# Patient Record
Sex: Male | Born: 1990
Health system: Southern US, Community
[De-identification: ages and names within clinical notes are randomized; demographics above are authoritative.]

## PROBLEM LIST (undated history)

## (undated) DIAGNOSIS — K5900 Constipation, unspecified: Secondary | ICD-10-CM

## (undated) HISTORY — DX: Constipation, unspecified: K59.00

## (undated) HISTORY — PX: HERNIA REPAIR: SHX51

---
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
## 2016-12-04 ENCOUNTER — Encounter (HOSPITAL_COMMUNITY): Payer: Self-pay | Admitting: Emergency Medicine

## 2016-12-04 ENCOUNTER — Emergency Department (HOSPITAL_COMMUNITY)
Admission: EM | Admit: 2016-12-04 | Discharge: 2016-12-05 | Disposition: A | Discharge status: Home / Self Care | Payer: BLUE CROSS/BLUE SHIELD | Attending: Emergency Medicine | Admitting: Emergency Medicine

## 2016-12-04 DIAGNOSIS — R109 Unspecified abdominal pain: Secondary | ICD-10-CM | POA: Diagnosis not present

## 2016-12-04 DIAGNOSIS — M545 Low back pain: Secondary | ICD-10-CM | POA: Diagnosis present

## 2016-12-04 LAB — URINALYSIS, ROUTINE W REFLEX MICROSCOPIC
Bilirubin Urine: NEGATIVE
Glucose, UA: NEGATIVE mg/dL
HGB URINE DIPSTICK: NEGATIVE
Ketones, ur: NEGATIVE mg/dL
Leukocytes, UA: NEGATIVE
NITRITE: NEGATIVE
PH: 6 (ref 5.0–8.0)
Protein, ur: NEGATIVE mg/dL
Specific Gravity, Urine: 1.016 (ref 1.005–1.030)

## 2016-12-04 NOTE — ED Provider Notes (Signed)
[facility]-EMERGENCY DEPT Provider Note   CSN: [id number] Arrival date & time: 12/04/16  2045     History   Chief Complaint Chief Complaint  Patient presents with  . Spasms  . Back Pain    HPI Jason Collins is a 26 y.o. male.  The history is provided by the patient.  Back Pain   This is a new problem. The current episode started 1 to 2 hours ago. Episode frequency: once. The problem has been resolved (lasted for 5 minutes). The pain is associated with no known injury. The pain is present in the lumbar spine. The quality of the pain is described as shooting. The pain does not radiate. The pain is severe. Pertinent negatives include no chest pain, no fever, no headaches, no abdominal pain, no abdominal swelling, no bowel incontinence, no perianal numbness, no bladder incontinence and no dysuria. Associated symptoms comments: emesis.    History reviewed. No pertinent past medical history.  There are no active problems to display for this patient.   Past Surgical History:  Procedure Laterality Date  . HERNIA REPAIR         Home Medications    Prior to Admission medications   Not on File    Family History No family history on file.  Social History Social History  Substance Use Topics  . Smoking status: Never Smoker  . Smokeless tobacco: Never Used  . Alcohol use Yes     Comment: socially     Allergies   Tramadol   Review of Systems Review of Systems  Constitutional: Negative for chills and fever.  HENT: Negative for ear pain and sore throat.   Eyes: Negative for pain and visual disturbance.  Respiratory: Negative for cough and shortness of breath.   Cardiovascular: Negative for chest pain and palpitations.  Gastrointestinal: Negative for abdominal pain, bowel incontinence and vomiting.  Genitourinary: Positive for flank pain. Negative for bladder incontinence, decreased urine volume, difficulty urinating, dysuria, frequency, hematuria, scrotal swelling and  testicular pain.  Musculoskeletal: Positive for back pain. Negative for arthralgias.  Skin: Negative for color change and rash.  Neurological: Negative for seizures, syncope and headaches.  All other systems reviewed and are negative. Ten systems are reviewed and are negative for acute change except as noted in the HPI    Physical Exam Updated Vital Signs BP 140/85 (BP Location: Right Arm)   Pulse 76   Temp 98.5 F (36.9 C) (Oral)   Resp 20   Ht 5\' 6"  (1.676 m)   Wt 130 lb (59 kg)   SpO2 100%   BMI 20.98 kg/m   Physical Exam  Constitutional: He is oriented to person, place, and time. He appears well-developed and well-nourished. No distress.  HENT:  Head: Normocephalic and atraumatic.  Nose: Nose normal.  Eyes: Conjunctivae and EOM are normal. Pupils are equal, round, and reactive to light. Right eye exhibits no discharge. Left eye exhibits no discharge. No scleral icterus.  Neck: Normal range of motion. Neck supple.  Cardiovascular: Normal rate and regular rhythm.  Exam reveals no gallop and no friction rub.   No murmur heard. Pulmonary/Chest: Effort normal and breath sounds normal. No stridor. No respiratory distress. He has no rales.  Abdominal: Soft. He exhibits no distension. There is no tenderness.  Musculoskeletal: He exhibits no edema or tenderness.  Neurological: He is alert and oriented to person, place, and time.  Skin: Skin is warm and dry. No rash noted. He is not diaphoretic. No erythema.  Psychiatric:  He has a normal mood and affect.  Vitals reviewed.    ED Treatments / Results  Labs (all labs ordered are listed, but only abnormal results are displayed) Labs Reviewed  URINALYSIS, ROUTINE W REFLEX MICROSCOPIC    EKG  EKG Interpretation None       Radiology No results found.  Procedures Procedures (including critical care time)  Medications Ordered in ED Medications - No data to display   Initial Impression / Assessment and Plan / ED  Course  I have reviewed the triage vital signs and the nursing notes.  Pertinent labs & imaging results that were available during my care of the patient were reviewed by me and considered in my medical decision making (see chart for details).     Resolved left flank pain. Currently asymptomatic. Possible renal colic vs muscle spasm. UA w/o hematuria or infection. Low suspicion PE, SBO, serious intra-abd inflammatory/infection process, AAA.  The patient is safe for discharge with strict return precautions.   Final Clinical Impressions(s) / ED Diagnoses   Final diagnoses:  Flank pain   Disposition: Discharge  Condition: Good  I have discussed the results, Dx and Tx plan with the patient who expressed understanding and agree(s) with the plan. Discharge instructions discussed at great length. The patient was given strict return precautions who verbalized understanding of the instructions. No further questions at time of discharge.    There are no discharge medications for this patient.   Follow Up: primary care provider  Schedule an appointment as soon as possible for a visit  As needed       Nira ConnPedro Eduardo Cardama, MD 12/05/16 [phone]

## 2016-12-04 NOTE — ED Triage Notes (Signed)
Pt reports having a back spasm around 2030 and the pain was so severe it made him nauseated and he vomited.  Pt's back still feels a little tight. Ambulatory.  A&O x4.  No other complaints at this time.

## 2016-12-05 NOTE — ED Notes (Signed)
Patient was alert, oriented and stable upon discharge. RN went over AVS and patient had no further questions.  

## 2018-11-12 DIAGNOSIS — J069 Acute upper respiratory infection, unspecified: Secondary | ICD-10-CM | POA: Diagnosis not present

## 2019-03-27 DIAGNOSIS — Z20828 Contact with and (suspected) exposure to other viral communicable diseases: Secondary | ICD-10-CM | POA: Diagnosis not present

## 2019-03-27 DIAGNOSIS — U071 COVID-19: Secondary | ICD-10-CM | POA: Diagnosis not present

## 2019-08-03 ENCOUNTER — Encounter: Payer: Self-pay | Admitting: Pulmonary Disease

## 2019-08-03 ENCOUNTER — Ambulatory Visit (INDEPENDENT_AMBULATORY_CARE_PROVIDER_SITE_OTHER): Payer: BC Managed Care – PPO | Admitting: Pulmonary Disease

## 2019-08-03 ENCOUNTER — Other Ambulatory Visit: Payer: Self-pay

## 2019-08-03 ENCOUNTER — Ambulatory Visit (INDEPENDENT_AMBULATORY_CARE_PROVIDER_SITE_OTHER): Payer: BC Managed Care – PPO

## 2019-08-03 VITALS — BP 124/70 | HR 70 | Temp 98.5°F | Ht 67.5 in | Wt 138.4 lb

## 2019-08-03 DIAGNOSIS — R059 Cough, unspecified: Secondary | ICD-10-CM

## 2019-08-03 DIAGNOSIS — R052 Subacute cough: Secondary | ICD-10-CM

## 2019-08-03 DIAGNOSIS — R05 Cough: Secondary | ICD-10-CM | POA: Diagnosis not present

## 2019-08-03 LAB — CBC WITH DIFFERENTIAL/PLATELET
Basophils Absolute: 0 10*3/uL (ref 0.0–0.1)
Basophils Relative: 0.4 % (ref 0.0–3.0)
Eosinophils Absolute: 0.1 10*3/uL (ref 0.0–0.7)
Eosinophils Relative: 1.5 % (ref 0.0–5.0)
HCT: 41.4 % (ref 39.0–52.0)
Hemoglobin: 14 g/dL (ref 13.0–17.0)
Lymphocytes Relative: 39.5 % (ref 12.0–46.0)
Lymphs Abs: 2.3 10*3/uL (ref 0.7–4.0)
MCHC: 33.8 g/dL (ref 30.0–36.0)
MCV: 91 fl (ref 78.0–100.0)
Monocytes Absolute: 0.4 10*3/uL (ref 0.1–1.0)
Monocytes Relative: 6.2 % (ref 3.0–12.0)
Neutro Abs: 3 10*3/uL (ref 1.4–7.7)
Neutrophils Relative %: 52.4 % (ref 43.0–77.0)
Platelets: 245 10*3/uL (ref 150.0–400.0)
RBC: 4.54 Mil/uL (ref 4.22–5.81)
RDW: 13.1 % (ref 11.5–15.5)
WBC: 5.7 10*3/uL (ref 4.0–10.5)

## 2019-08-03 MED ORDER — ALBUTEROL SULFATE HFA 108 (90 BASE) MCG/ACT IN AERS: 2.0000 | INHALATION_SPRAY | Freq: Four times a day (QID) | RESPIRATORY_TRACT | 6 refills | Status: DC | PRN

## 2019-08-03 MED ORDER — BREO ELLIPTA 100-25 MCG/INH IN AEPB: 1.0000 | INHALATION_SPRAY | Freq: Every day | RESPIRATORY_TRACT | 0 refills | Status: DC

## 2019-08-03 NOTE — Progress Notes (Signed)
Subjective:   PATIENT ID: Jason Collins GENDER: male DOB: [birth year], MRN: [id number]   HPI  Chief Complaint  Patient presents with  . Consult    Patient is here for a cough that he has had for 2 months. Patient says its worse at night   Reason for Visit: New consult for chronic cough  [name] is an 28 year old male never smoker with no prior medical health issues who presents with chronic cough.  He reports two month history of chronic cough. When he talks rapidly and quickly, he feels like his lungs "deflate" quickly and result in rapid exhalations or dry cough. It seems to happen more frequently in the evening and night. He has a sensation of a hair or irritation in his throat prior to the cough. The last time he was ill was in January and was treated with antibiotics.  He has hx of nonsignificant sinus congestions that are usually seasonal. Denies changes in taste or smell. He currently works at home since March. Denies recent travel except to the beach one month ago in [place]. Denies chest tightness, shortness of breath, wheezing. He has a prior history of reflux in college.   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  History reviewed. No pertinent past medical history.   Family History  Problem Relation Age of Onset  . Cancer Maternal Grandmother   . Cancer Maternal Grandfather      Social History   Occupational History  . Not on file  Tobacco Use  . Smoking status: Never Smoker  . Smokeless tobacco: Never Used  Substance and Sexual Activity  . Alcohol use: Yes    Frequency: Never    Comment: socially  . Drug use: No  . Sexual activity: Yes    Allergies  Allergen Reactions  . Tramadol Nausea And Vomiting     No outpatient  medications prior to visit.   No facility-administered medications prior to visit.     Review of Systems  Constitutional: Negative for chills, diaphoresis, fever, malaise/fatigue and weight loss.  HENT: Negative for congestion, ear pain and sore throat.   Respiratory: Positive for cough. Negative for hemoptysis, sputum production, shortness of breath and wheezing.   Cardiovascular: Negative for chest pain, palpitations and leg swelling.  Gastrointestinal: Negative for abdominal pain, heartburn and nausea.  Genitourinary: Negative for frequency.  Musculoskeletal: Negative for joint pain and myalgias.  Skin: Negative for itching and rash.  Neurological: Negative for dizziness, weakness and headaches.  Endo/Heme/Allergies: Does not bruise/bleed easily.  Psychiatric/Behavioral: Negative for depression. The patient is nervous/anxious.     Objective:   Vitals:   08/03/19 1426  BP: 124/70  Pulse: 70  Temp: 98.5 F (36.9 C)  TempSrc: Temporal  SpO2: 99%  Weight: 138 lb 6.4 oz (62.8 kg)  Height: 5' 7.5" (1.715 m)   SpO2: 99 % O2 Device: None (Room air)  Physical Exam: General: Well-appearing, no acute distress HENT: [place], AT Eyes: EOMI, no scleral icterus Respiratory: Clear to auscultation bilaterally.  No crackles, wheezing or rales Cardiovascular: RRR, -M/R/G, no JVD GI: BS+, soft, nontender Extremities:-Edema,-tenderness Neuro: AAO x4, CNII-XII grossly intact Skin: Intact, no rashes or bruising Psych: Normal mood, normal affect  Data Reviewed:  Imaging: CXR 08/03/19 - Mild hyperinflation. Will correlate with PFTs  PFT: None available  Labs: CBC    Component Value Date/Time   WBC 5.7 08/03/2019 1531   RBC 4.54 08/03/2019 1531   HGB 14.0 08/03/2019 1531   HCT 41.4 08/03/2019 1531   PLT 245.0 08/03/2019 1531   MCV 91.0 08/03/2019 1531   MCHC 33.8 08/03/2019 1531   RDW 13.1 08/03/2019 1531   LYMPHSABS 2.3 08/03/2019 1531   MONOABS 0.4 08/03/2019 1531   EOSABS  0.1 08/03/2019 1531   BASOSABS 0.0 08/03/2019 1531   Imaging, labs and tests noted above have been reviewed independently by me.    Assessment & Plan:   Discussion: [age] year male with subacute cough x 2 years. Remote reflux history. Cough is atypical in presentation and I suspect represents abnormal airway disease. Will evaluate with PFTs. We discussed common causes for cough including post-infectious (low suspicion), asthma, reflux and post-nasal drip.  Subacute Cough --START Breo 100-25 mcg sample. TAKE ONE PUFF ONCE A DAY.            Please call our office if you benefit from this inhaler and we can order a prescription. --START Albuterol every 4 hours as needed for cough, shortness of breath or wheezing. --CXR today. We will call you if results are abnormal. --Labs ordered: CBC with diff and IgE --Pulmonary function tests will be arranged when available  ?Reflux --Consider starting omeprazole 20 mg daily for reflux symptoms. This can purchased over the counter. Patient declined plan for now.  Health Maintenance  There is no immunization history on file for this patient.   Orders Placed This Encounter  Procedures  . DG Chest 2 View    Standing Status:   Future    Number of Occurrences:   1    Standing Expiration Date:   10/02/2020    Order Specific Question:   Reason for Exam (SYMPTOM  OR DIAGNOSIS REQUIRED)    Answer:   Cough    Order Specific Question:   Preferred imaging location?    Answer:   Internal    Order Specific Question:   Radiology Contrast Protocol - do NOT remove file path    Answer:   [email or web address]  . CBC w/Diff    Standing Status:   Future    Number of Occurrences:   1    Standing Expiration Date:   08/02/2020  . IgE    Standing Status:   Future    Number of Occurrences:   1    Standing Expiration Date:   08/02/2020  . Pulmonary function test    Standing Status:   Future    Standing Expiration Date:    08/02/2020    Order Specific Question:   Where should this test be performed?    Answer:   [facility]    Order Specific Question:   Full PFT: includes the following: basic spirometry, spirometry pre & post bronchodilator, diffusion capacity (DLCO), lung volumes    Answer:   Full PFT   Meds ordered this encounter  Medications  . fluticasone furoate-vilanterol (BREO ELLIPTA) 100-25 MCG/INH AEPB    Sig: Inhale 1 puff into the lungs daily.    Dispense:  28 each    Refill:  0    Order Specific Question:   Lot Number?    Answer:   [id number]    Order Specific Question:   Expiration Date?    Answer:   11/15/2019    Order Specific Question:   Manufacturer?    Answer:   [employer] [[id number]]  Order Specific Question:   Quantity    Answer:   1  . albuterol (VENTOLIN HFA) 108 (90 Base) MCG/ACT inhaler    Sig: Inhale 2 puffs into the lungs every 6 (six) hours as needed for wheezing or shortness of breath.    Dispense:  6.7 g    Refill:  6    Return in about 3 months (around 11/03/2019).  Gattman, MD [facility] 08/03/2019 5:31 PM  Office Number [phone]

## 2019-08-03 NOTE — Patient Instructions (Addendum)
Subacute Cough --START Breo 100-25 mcg sample. TAKE ONE PUFF ONCE A DAY.            Please call our office if you benefit from this inhaler and we can order a prescription. --START Albuterol every 4 hours as needed for cough, shortness of breath or wheezing. --CXR today. We will call you if results are abnormal. --Labs ordered: CBC with diff and IgE --Pulmonary function tests will be arranged when available  ?Reflux --Consider starting omeprazole 20 mg daily for reflux symptoms. This can purchased over the counter.  Follow-up with Dr. Loanne Drilling or NP after PFTs

## 2019-08-03 NOTE — Progress Notes (Signed)
Patient seen in the office today and instructed on use of ellipta.  Patient expressed understanding and demonstrated technique.  

## 2019-08-04 ENCOUNTER — Other Ambulatory Visit: Payer: Self-pay

## 2019-08-04 DIAGNOSIS — Z20822 Contact with and (suspected) exposure to covid-19: Secondary | ICD-10-CM

## 2019-08-04 LAB — IGE: IgE (Immunoglobulin E), Serum: <2 kU/L (ref <=114)

## 2019-08-06 LAB — NOVEL CORONAVIRUS, NAA: SARS-CoV-2, NAA: NOT DETECTED

## 2019-08-17 ENCOUNTER — Other Ambulatory Visit: Payer: Self-pay

## 2019-08-17 ENCOUNTER — Telehealth: Payer: Self-pay | Admitting: Pulmonary Disease

## 2019-08-17 MED ORDER — BREO ELLIPTA 100-25 MCG/INH IN AEPB: 1.0000 | INHALATION_SPRAY | Freq: Every day | RESPIRATORY_TRACT | 1 refills | Status: DC

## 2019-08-17 NOTE — Telephone Encounter (Signed)
Rx for Breo sent  Pt aware  Nothing further needed

## 2019-09-28 ENCOUNTER — Other Ambulatory Visit (HOSPITAL_COMMUNITY)
Admission: RE | Admit: 2019-09-28 | Discharge: 2019-09-28 | Disposition: A | Discharge status: Home / Self Care | Payer: BC Managed Care – PPO | Source: Ambulatory Visit | Attending: Pulmonary Disease | Admitting: Pulmonary Disease

## 2019-09-28 DIAGNOSIS — Z20822 Contact with and (suspected) exposure to covid-19: Secondary | ICD-10-CM | POA: Diagnosis not present

## 2019-09-28 DIAGNOSIS — Z01812 Encounter for preprocedural laboratory examination: Secondary | ICD-10-CM | POA: Diagnosis not present

## 2019-09-30 LAB — NOVEL CORONAVIRUS, NAA (HOSP ORDER, SEND-OUT TO REF LAB; TAT 18-24 HRS): SARS-CoV-2, NAA: NOT DETECTED

## 2019-10-01 ENCOUNTER — Encounter: Payer: Self-pay | Admitting: Primary Care

## 2019-10-01 ENCOUNTER — Ambulatory Visit (INDEPENDENT_AMBULATORY_CARE_PROVIDER_SITE_OTHER): Payer: BC Managed Care – PPO | Admitting: Pulmonary Disease

## 2019-10-01 ENCOUNTER — Other Ambulatory Visit: Payer: Self-pay

## 2019-10-01 ENCOUNTER — Ambulatory Visit (INDEPENDENT_AMBULATORY_CARE_PROVIDER_SITE_OTHER): Payer: BC Managed Care – PPO | Admitting: Primary Care

## 2019-10-01 DIAGNOSIS — R052 Subacute cough: Secondary | ICD-10-CM

## 2019-10-01 DIAGNOSIS — R05 Cough: Secondary | ICD-10-CM

## 2019-10-01 DIAGNOSIS — R059 Cough, unspecified: Secondary | ICD-10-CM

## 2019-10-01 LAB — PULMONARY FUNCTION TEST
DL/VA % pred: 96 %
DL/VA: 4.81 ml/min/mmHg/L
DLCO unc % pred: 108 %
DLCO unc: 33.22 ml/min/mmHg
FEF 25-75 Post: 3.72 L/sec
FEF 25-75 Pre: 3.85 L/sec
FEF2575-%Change-Post: -3 %
FEF2575-%Pred-Post: 86 %
FEF2575-%Pred-Pre: 89 %
FEV1-%Change-Post: 0 %
FEV1-%Pred-Post: 104 %
FEV1-%Pred-Pre: 105 %
FEV1-Post: 4.41 L
FEV1-Pre: 4.43 L
FEV1FVC-%Change-Post: 1 %
FEV1FVC-%Pred-Pre: 94 %
FEV6-%Change-Post: -2 %
FEV6-%Pred-Post: 109 %
FEV6-%Pred-Pre: 112 %
FEV6-Post: 5.58 L
FEV6-Pre: 5.73 L
FEV6FVC-%Change-Post: 0 %
FEV6FVC-%Pred-Post: 101 %
FEV6FVC-%Pred-Pre: 100 %
FVC-%Change-Post: -1 %
FVC-%Pred-Post: 109 %
FVC-%Pred-Pre: 111 %
FVC-Post: 5.64 L
FVC-Pre: 5.74 L
Post FEV1/FVC ratio: 78 %
Post FEV6/FVC ratio: 100 %
Pre FEV1/FVC ratio: 77 %
Pre FEV6/FVC Ratio: 100 %
RV % pred: 107 %
RV: 1.63 L
TLC % pred: 112 %
TLC: 7.29 L

## 2019-10-01 NOTE — Progress Notes (Signed)
@Patient  ID: , male    DOB: August 15, 1991, 29 y.o.   MRN: [id number]  Chief Complaint  Patient presents with  . Follow-up    Referring provider: No ref. provider found  HPI: 29 year old male, never smoked.  No prior significant past medical history.  Patient of Dr. 26, seen for initial consult 08/03/2019 d/t cough for 2 months.  Remote history of acid reflux. He was started on Breo 101 puff once daily and albuterol HFA 2 puffs every 4 hours as needed. CXR 08/03/19 showed clear lungs. IgE and eosinophils are normal.  Negative for Covid.  Ordered for pulmonary function test.  Consider adding omeprazole 20 mg daily for reflux.   10/01/2019 Patient presents today for 22-month follow-up with pulmonary function testing. He is doing well, no acute complaints. Reports that his cough improved/resolved shortly after starting Breo Ellipta. He has on occasion missed a dose of Breo and reports no significant changes in his symptoms when off of it. Rare SABA use. Denies shortness of breath, chest tightness or cough.   PFTs 10/01/2019 FVC 5.64 (109%), FEV1 4.41 (103%), ratio 78, no BD response. Normal flow volume loop and diffusion capacity. Normal spirometry with no evidence of restriction or obstruction.   Allergies  Allergen Reactions  . Tramadol Nausea And Vomiting     There is no immunization history on file for this patient.  No past medical history on file.  Tobacco History: Social History   Tobacco Use  Smoking Status Never Smoker  Smokeless Tobacco Never Used   Counseling given: Not Answered   Outpatient Medications Prior to Visit  Medication Sig Dispense Refill  . albuterol (VENTOLIN HFA) 108 (90 Base) MCG/ACT inhaler Inhale 2 puffs into the lungs every 6 (six) hours as needed for wheezing or shortness of breath. 6.7 g 6  . fluticasone furoate-vilanterol (BREO ELLIPTA) 100-25 MCG/INH AEPB Inhale 1 puff into the lungs daily. 60 each 1   No facility-administered  medications prior to visit.   Review of Systems  Review of Systems  Constitutional: Negative.   Respiratory: Negative for cough, shortness of breath and wheezing.    Physical Exam  BP 120/68 (BP Location: Left Arm, Cuff Size: Normal)   Pulse (!) 109   Temp 98 F (36.7 C) (Temporal)   Ht 5\' 8"  (1.727 m)   Wt 136 lb (61.7 kg)   SpO2 99%   BMI 20.68 kg/m  Physical Exam Constitutional:      Appearance: Normal appearance.  HENT:     Head: Normocephalic and atraumatic.     Mouth/Throat:     Mouth: Mucous membranes are moist.     Pharynx: Oropharynx is clear.  Cardiovascular:     Rate and Rhythm: Normal rate and regular rhythm.  Pulmonary:     Effort: Pulmonary effort is normal.     Breath sounds: Normal breath sounds. No wheezing, rhonchi or rales.     Comments: CTA; no cough observed Skin:    General: Skin is warm and dry.  Neurological:     General: No focal deficit present.     Mental Status: He is alert and oriented to person, place, and time. Mental status is at baseline.  Psychiatric:        Mood and Affect: Mood normal.        Behavior: Behavior normal.        Thought Content: Thought content normal.        Judgment: Judgment normal.      Lab  Results:  CBC    Component Value Date/Time   WBC 5.7 08/03/2019 1531   RBC 4.54 08/03/2019 1531   HGB 14.0 08/03/2019 1531   HCT 41.4 08/03/2019 1531   PLT 245.0 08/03/2019 1531   MCV 91.0 08/03/2019 1531   MCHC 33.8 08/03/2019 1531   RDW 13.1 08/03/2019 1531   LYMPHSABS 2.3 08/03/2019 1531   MONOABS 0.4 08/03/2019 1531   EOSABS 0.1 08/03/2019 1531   BASOSABS 0.0 08/03/2019 1531    BMET No results found for: NA, K, CL, CO2, GLUCOSE, BUN, CREATININE, CALCIUM, GFRNONAA, GFRAA  BNP No results found for: BNP  ProBNP No results found for: PROBNP  Imaging: No results found.   Assessment & Plan:   Subacute cough - Cough has improved - Pulmonary function testing appears normal - CXR and CBC/IgE normal   - Recommend trial off/taper Breo (if cough returns you can resume) - Use albuterol rescue inhaler 2 puffs every 4-6 hours for breakthrough shortness of breath/wheezing or cough  - Return as needed if symptoms return/worsen      Martyn Ehrich, NP 10/01/2019

## 2019-10-01 NOTE — Progress Notes (Signed)
Full PFT performed today. °

## 2019-10-01 NOTE — Patient Instructions (Signed)
Pulmonary function testing appears normal CXR normal Labs normal   Recommendations:  - Trial off/taper Breo (if cough returns you can resume) - Use albuterol rescue inhaler 2 puffs every 4-6 hours for breakthrough shortness of breath/wheezing or cough   Follow-up: - Return as needed if symptoms return/worsen  (Will discuss above plan with Dr. Everardo All, if she has anything to add will notify you next week)

## 2019-10-01 NOTE — Assessment & Plan Note (Signed)
-   Cough has improved - Pulmonary function testing appears normal - CXR and CBC/IgE normal  - Recommend trial off/taper Breo (if cough returns you can resume) - Use albuterol rescue inhaler 2 puffs every 4-6 hours for breakthrough shortness of breath/wheezing or cough  - Return as needed if symptoms return/worsen

## 2019-11-27 DIAGNOSIS — Z23 Encounter for immunization: Secondary | ICD-10-CM | POA: Diagnosis not present

## 2019-12-30 DIAGNOSIS — Z23 Encounter for immunization: Secondary | ICD-10-CM | POA: Diagnosis not present

## 2020-05-21 DIAGNOSIS — Z20822 Contact with and (suspected) exposure to covid-19: Secondary | ICD-10-CM | POA: Diagnosis not present

## 2020-07-26 DIAGNOSIS — Z20822 Contact with and (suspected) exposure to covid-19: Secondary | ICD-10-CM | POA: Diagnosis not present

## 2020-08-14 IMAGING — DX DG CHEST 2V
2 series · 2 of 2 positions shown · non-contrast
Comparison: None

CLINICAL DATA: 28-year-old male with cough.

EXAM:
CHEST - 2 VIEW

[chest pa]
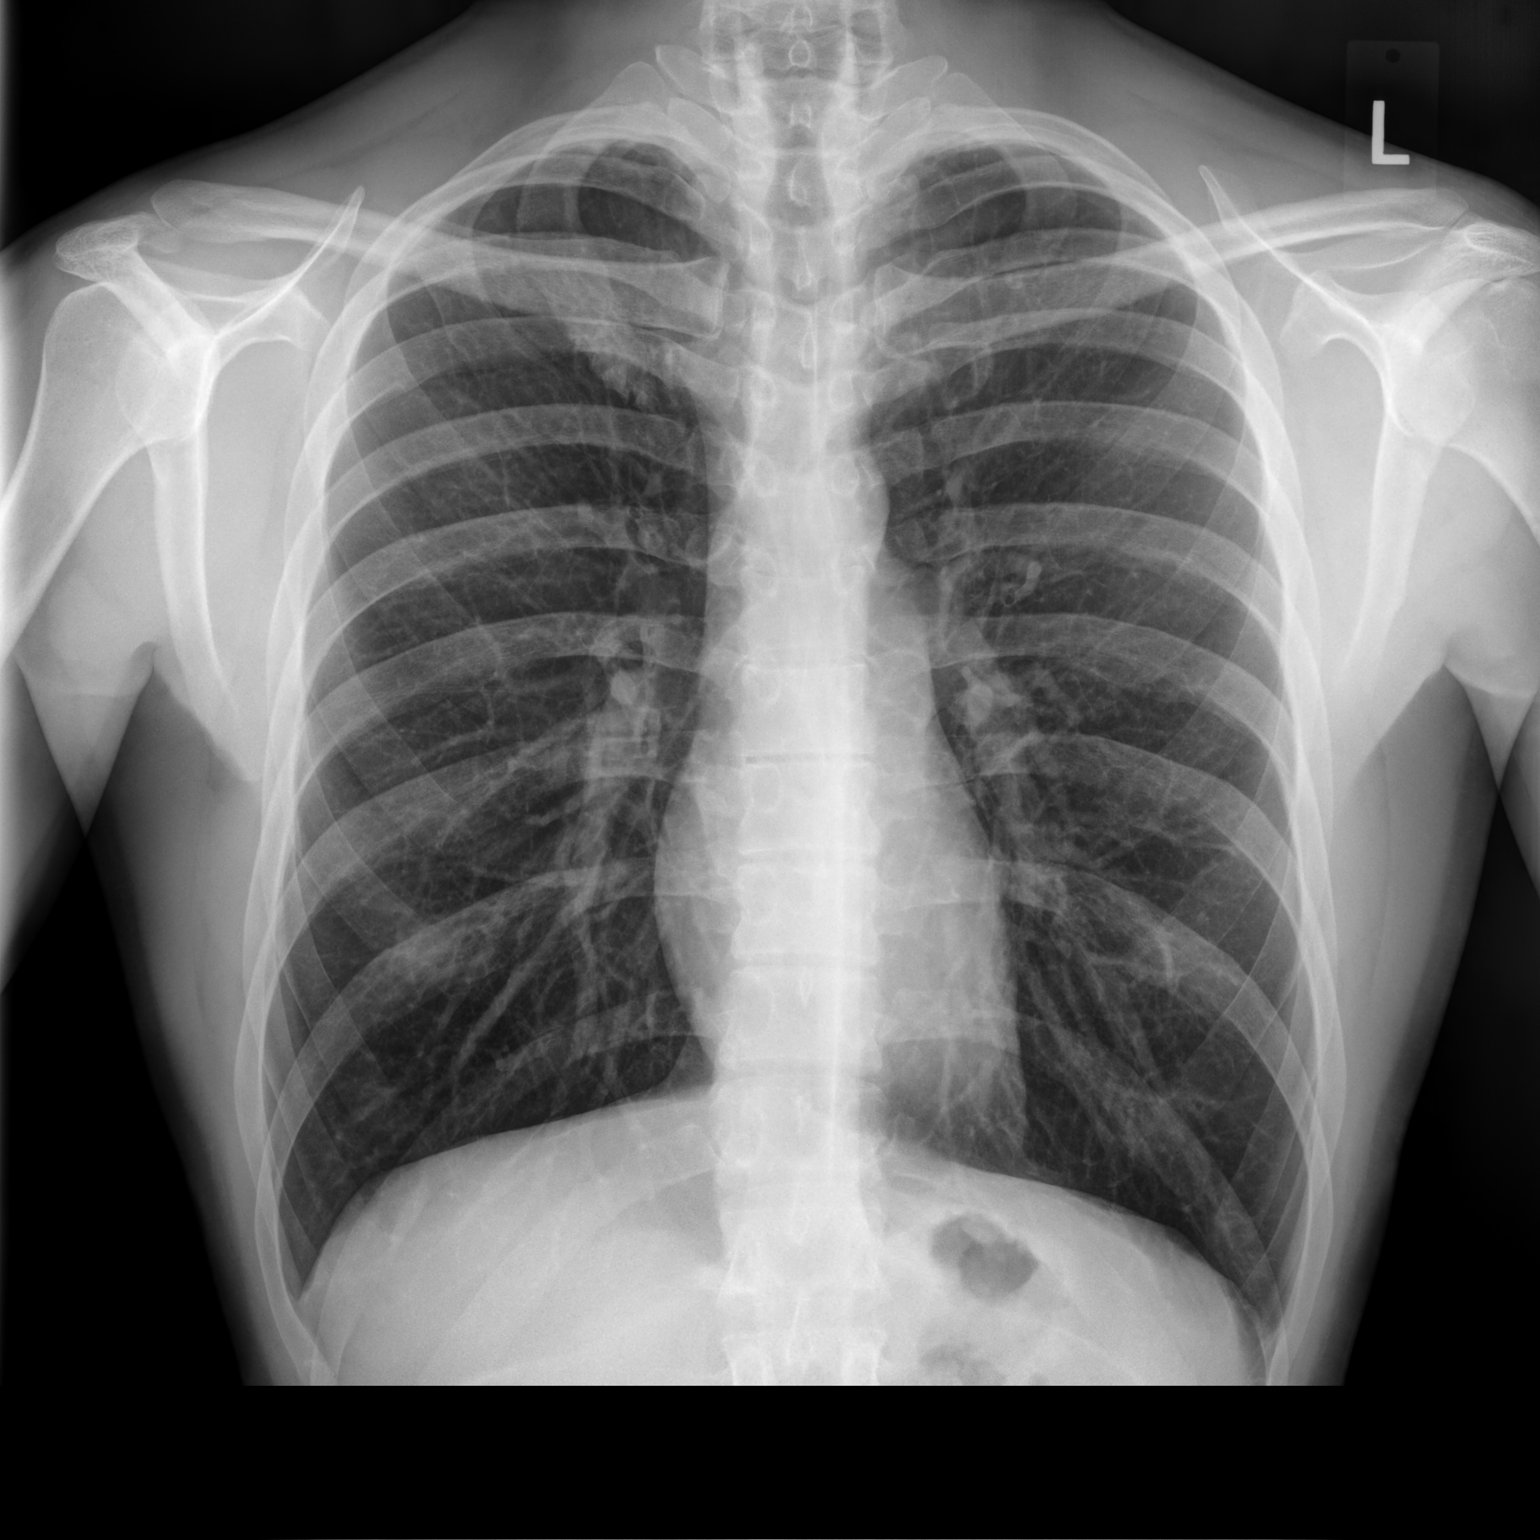

[chest lat]
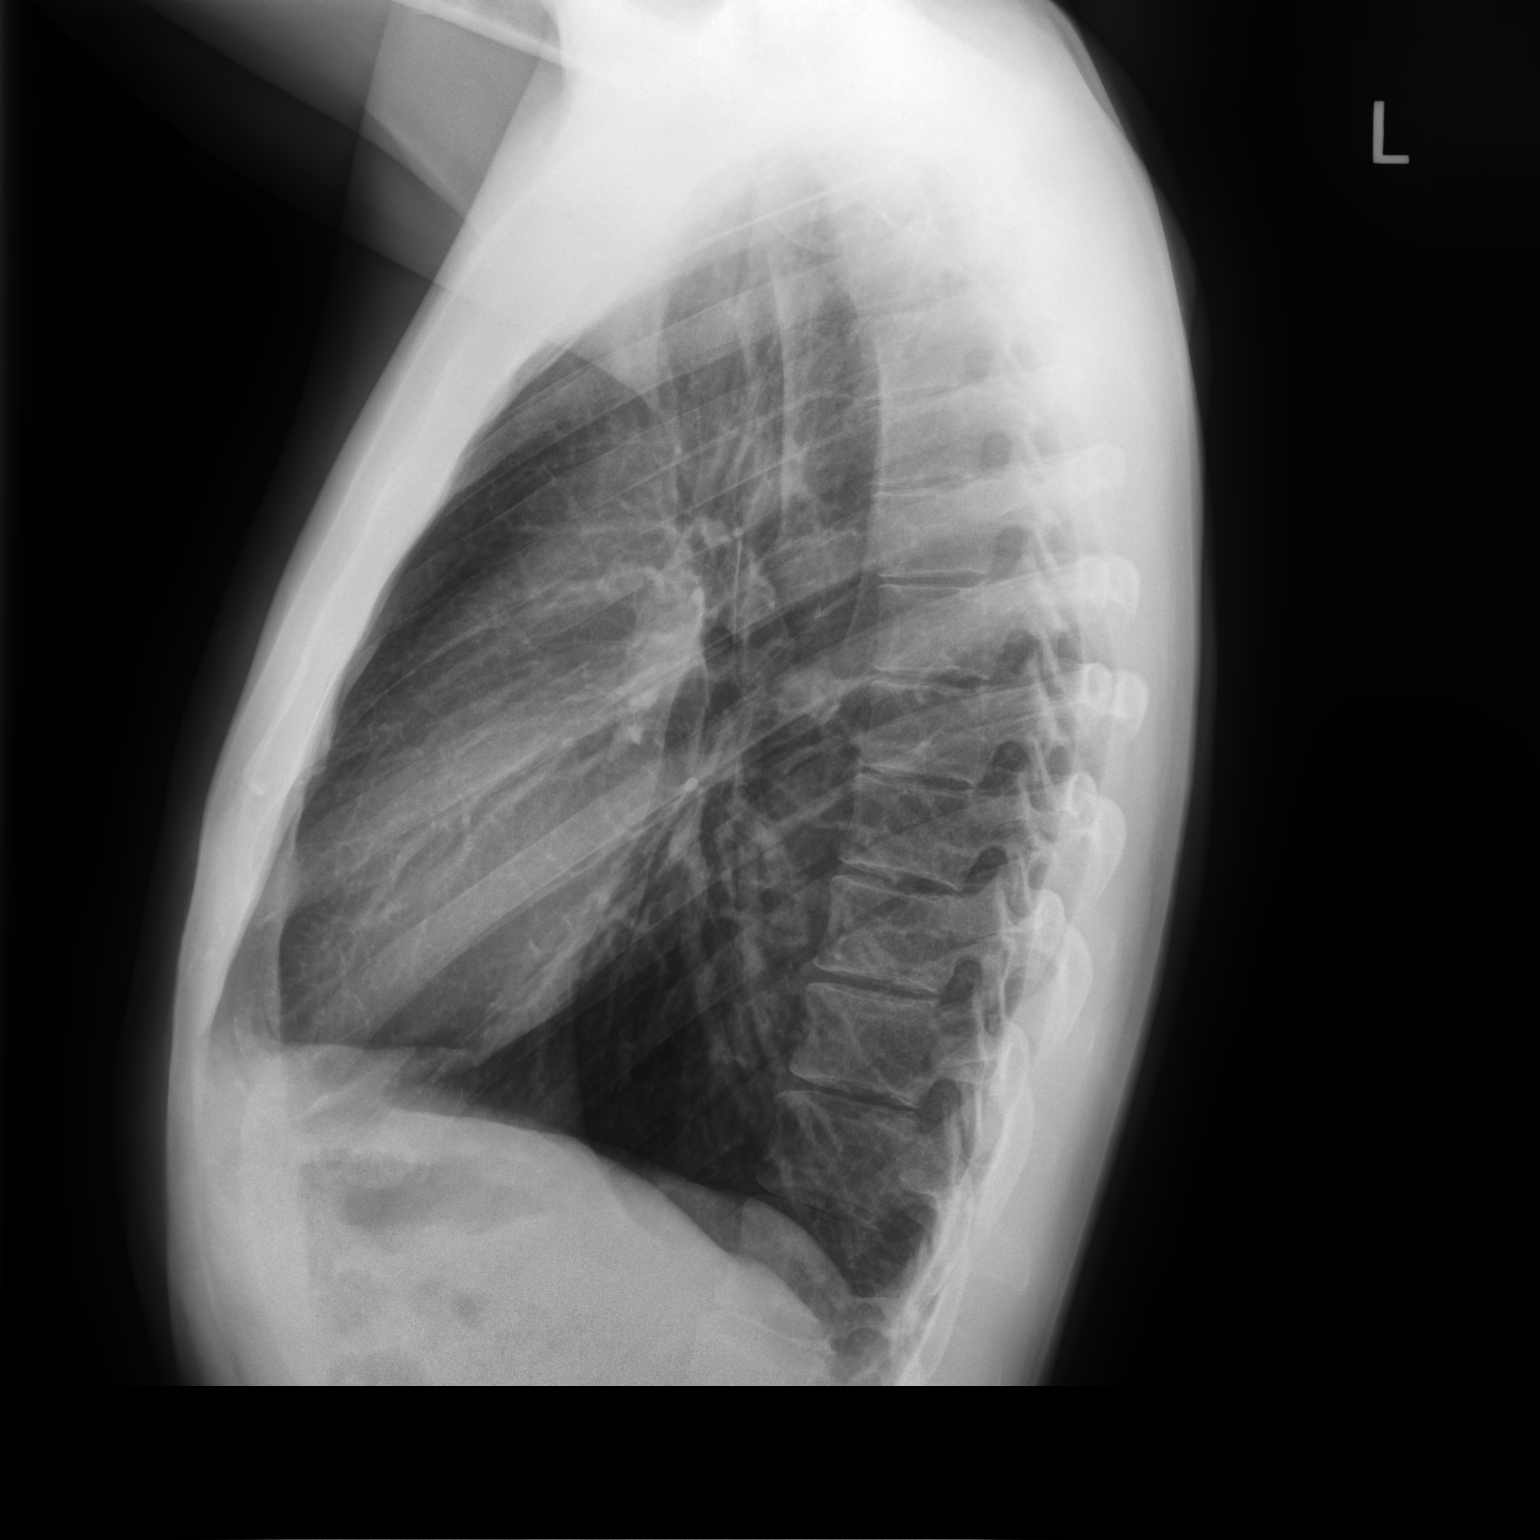

[2 of 2 positions shown; findings below may reference images not displayed]

FINDINGS: The heart size and mediastinal contours are within normal limits.
Both lungs are clear. The visualized skeletal structures are
unremarkable.
IMPRESSION: No active cardiopulmonary disease.

## 2020-09-29 DIAGNOSIS — Z1152 Encounter for screening for COVID-19: Secondary | ICD-10-CM | POA: Diagnosis not present

## 2020-10-03 ENCOUNTER — Other Ambulatory Visit: Payer: Self-pay | Admitting: Pulmonary Disease

## 2020-10-06 DIAGNOSIS — Z1152 Encounter for screening for COVID-19: Secondary | ICD-10-CM | POA: Diagnosis not present

## 2020-10-23 ENCOUNTER — Other Ambulatory Visit: Payer: Self-pay | Admitting: Pulmonary Disease

## 2021-03-21 ENCOUNTER — Telehealth: Payer: Self-pay | Admitting: Primary Care

## 2021-03-21 MED ORDER — ALBUTEROL SULFATE HFA 108 (90 BASE) MCG/ACT IN AERS: 2.0000 | INHALATION_SPRAY | Freq: Four times a day (QID) | RESPIRATORY_TRACT | 0 refills | Status: DC | PRN

## 2021-03-21 NOTE — Telephone Encounter (Signed)
Called and spoke with pt stating to him that I would send Rx for his rescue inhaler to the pharmacy for him. Asked him what pharmacy he wanted it to be [place]. Was given info for that pharmacy and Rx for pt's albuterol inhaler has been sent in for pt. Nothing further needed.

## 2021-03-26 ENCOUNTER — Ambulatory Visit: Payer: BC Managed Care – PPO | Admitting: Primary Care

## 2021-04-16 ENCOUNTER — Encounter: Payer: Self-pay | Admitting: Adult Health

## 2021-04-16 ENCOUNTER — Ambulatory Visit (INDEPENDENT_AMBULATORY_CARE_PROVIDER_SITE_OTHER): Payer: BC Managed Care – PPO | Admitting: Adult Health

## 2021-04-16 ENCOUNTER — Other Ambulatory Visit: Payer: Self-pay

## 2021-04-16 DIAGNOSIS — J309 Allergic rhinitis, unspecified: Secondary | ICD-10-CM | POA: Diagnosis not present

## 2021-04-16 DIAGNOSIS — J4531 Mild persistent asthma with (acute) exacerbation: Secondary | ICD-10-CM | POA: Diagnosis not present

## 2021-04-16 MED ORDER — DULERA 100-5 MCG/ACT IN AERO: 1.0000 | INHALATION_SPRAY | Freq: Two times a day (BID) | RESPIRATORY_TRACT | 5 refills | Status: DC

## 2021-04-16 NOTE — Patient Instructions (Addendum)
Begin Dulera 100 1  puffs Twice daily , rinse after use.  Albuterol Inhaler 1-2 puffs every 6hrs as needed.  Claritin 10mg  daily As needed  drainage.  Follow up in 3 months with Dr. and As needed

## 2021-04-16 NOTE — Progress Notes (Signed)
@Patient  ID: , male    DOB: 11/04/1990, 30 y.o.   MRN: [id number]  Chief Complaint  Patient presents with   Follow-up     Referring provider: No ref. provider found  HPI: 30 yo male never smoker followed for cough   TEST/EVENTS :   PFTs 10/01/2019 FVC 5.64 (109%), FEV1 4.41 (103%), ratio 78, no BD response. Normal flow volume loop and diffusion capacity. Normal spirometry with no evidence of restriction or obstruction.  04/16/2021 Follow up : Cough  Patient presents for a follow up visit. Seen previously for upper airway cough .  Recent developed acute upper respiratory symptoms with cough, congestion, fever and sore throat.  He was traveling out of town- 06/16/2021 .  He tested positive for COVID-19 the week of July 4.  He did have some increased shortness of breath and wheezing.  Was seen in the emergency room.  And given a prednisone pack and albuterol.  ER records reviewed .  Chest x-ray showed clear lungs patient says he is feeling better but continues to have some ongoing lingering cough and intermittent wheezing.  He has been vaccinated for COVID x2 with no booster.  He denies any chest pain, hemoptysis, fever, edema or calf pain.    Allergies  Allergen Reactions   Tramadol Nausea And Vomiting     There is no immunization history on file for this patient.  History reviewed. No pertinent past medical history.  Tobacco History: Social History   Tobacco Use  Smoking Status Never  Smokeless Tobacco Never   Counseling given: Not Answered   Outpatient Medications Prior to Visit  Medication Sig Dispense Refill   albuterol (VENTOLIN HFA) 108 (90 Base) MCG/ACT inhaler Inhale 2 puffs into the lungs every 6 (six) hours as needed for wheezing or shortness of breath. 6.7 g 0   No facility-administered medications prior to visit.     Review of Systems:   Constitutional:   No  weight loss, night sweats,  Fevers, chills, fatigue, or  lassitude.  HEENT:   No  headaches,  Difficulty swallowing,  Tooth/dental problems, or  Sore throat,                No sneezing, itching, ear ache, nasal congestion, post nasal drip,   CV:  No chest pain,  Orthopnea, PND, swelling in lower extremities, anasarca, dizziness, palpitations, syncope.   GI  No heartburn, indigestion, abdominal pain, nausea, vomiting, diarrhea, change in bowel habits, loss of appetite, bloody stools.   Resp: No shortness of breath with exertion or at rest.  No excess mucus, no productive cough,  No non-productive cough,  No coughing up of blood.  No change in color of mucus.  No wheezing.  No chest wall deformity  Skin: no rash or lesions.  GU: no dysuria, change in color of urine, no urgency or frequency.  No flank pain, no hematuria   MS:  No joint pain or swelling.  No decreased range of motion.  No back pain.    Physical Exam    GEN: A/Ox3; pleasant , NAD, well nourished    HEENT:  /AT,  EACs-clear, TMs-wnl, NOSE-clear, THROAT-clear, no lesions, no postnasal drip or exudate noted.   NECK:  Supple w/ fair ROM; no JVD; normal carotid impulses w/o bruits; no thyromegaly or nodules palpated; no lymphadenopathy.    RESP  Clear  P & A; w/o, wheezes/ rales/ or rhonchi. no accessory muscle use, no dullness to percussion  CARD:  RRR,  no m/r/g, no peripheral edema, pulses intact, no cyanosis or clubbing.  GI:   Soft & nt; nml bowel sounds; no organomegaly or masses detected.   Musco: Warm bil, no deformities or joint swelling noted.   Neuro: alert, no focal deficits noted.    Skin: Warm, no lesions or rashes    Lab Results:   BMET No results found for: NA, K, CL, CO2, GLUCOSE, BUN, CREATININE, CALCIUM, GFRNONAA, GFRAA  BNP No results found for: BNP  ProBNP No results found for: PROBNP  Imaging: No results found.    PFT Results Latest Ref Rng & Units 10/01/2019  FVC-Pre L 5.74  FVC-Predicted Pre % 111  FVC-Post L 5.64  FVC-Predicted Post % 109  Pre FEV1/FVC  % % 77  Post FEV1/FCV % % 78  FEV1-Pre L 4.43  FEV1-Predicted Pre % 105  FEV1-Post L 4.41  DLCO uncorrected ml/min/mmHg 33.22  DLCO UNC% % 108  DLVA Predicted % 96  TLC L 7.29  TLC % Predicted % 112  RV % Predicted % 107    No results found for: NITRICOXIDE      Assessment & Plan:   No problem-specific Assessment & Plan notes found for this encounter.     Rubye Oaks, NP 04/16/2021

## 2021-04-20 DIAGNOSIS — J45909 Unspecified asthma, uncomplicated: Secondary | ICD-10-CM | POA: Insufficient documentation

## 2021-04-20 DIAGNOSIS — J309 Allergic rhinitis, unspecified: Secondary | ICD-10-CM | POA: Insufficient documentation

## 2021-04-20 NOTE — Assessment & Plan Note (Signed)
Add Claritin as needed

## 2021-04-20 NOTE — Assessment & Plan Note (Signed)
Ongoing symptoms of cough and wheezing post viral infection.  May have a component of some reactive airways /asthma. Begin trial of [facility]  Plan Patient Instructions  Begin Dulera 100 1  puffs Twice daily , rinse after use.  Albuterol Inhaler 1-2 puffs every 6hrs as needed.  Claritin 10mg  daily As needed  drainage.  Follow up in 3 months with Dr. and As needed

## 2021-08-07 DIAGNOSIS — K649 Unspecified hemorrhoids: Secondary | ICD-10-CM | POA: Diagnosis not present

## 2023-03-10 ENCOUNTER — Telehealth: Payer: Self-pay | Admitting: Pulmonary Disease

## 2023-03-10 MED ORDER — ALBUTEROL SULFATE HFA 108 (90 BASE) MCG/ACT IN AERS: 2.0000 | INHALATION_SPRAY | Freq: Four times a day (QID) | RESPIRATORY_TRACT | 0 refills | Status: DC | PRN

## 2023-03-10 MED ORDER — DULERA 100-5 MCG/ACT IN AERO: 1.0000 | INHALATION_SPRAY | Freq: Two times a day (BID) | RESPIRATORY_TRACT | 0 refills | Status: DC

## 2023-03-10 NOTE — Telephone Encounter (Signed)
PT has not been seen in a couple of years. Has made an appt for this Wed. But seeks to have albuterol and Dulera called in to his Pharm. I told him I would put the request back,. He is having Asthma flare up. His # is [phone]. Please call to advise if any action taken.   Pharm is [facility] off of [address] [place]

## 2023-03-10 NOTE — Telephone Encounter (Signed)
Called and spoke with patient. Advised patient I would send in his Rx's to his pharmacy and to make sure he comes to his appointment on the 26th.   Nothing further needed.

## 2023-03-12 ENCOUNTER — Ambulatory Visit (INDEPENDENT_AMBULATORY_CARE_PROVIDER_SITE_OTHER): Payer: BC Managed Care – PPO | Admitting: Pulmonary Disease

## 2023-03-12 ENCOUNTER — Encounter (HOSPITAL_BASED_OUTPATIENT_CLINIC_OR_DEPARTMENT_OTHER): Payer: Self-pay | Admitting: Pulmonary Disease

## 2023-03-12 VITALS — BP 114/62 | HR 105 | Ht 67.0 in | Wt 128.0 lb

## 2023-03-12 DIAGNOSIS — J4530 Mild persistent asthma, uncomplicated: Secondary | ICD-10-CM

## 2023-03-12 MED ORDER — DULERA 100-5 MCG/ACT IN AERO: 1.0000 | INHALATION_SPRAY | Freq: Two times a day (BID) | RESPIRATORY_TRACT | 2 refills | Status: DC

## 2023-03-12 NOTE — Patient Instructions (Signed)
Asthma --CONTINUE Dulera 100-5 mcg TWO puffs in the morning and evening. Rinse mouth after use. --CONTINUE Albuterol TWO puffs as needed for shortness of breath or wheezing

## 2023-03-12 NOTE — Progress Notes (Signed)
Subjective:   PATIENT ID: Jason Collins GENDER: male DOB: 1991/09/08, MRN: [id number]   HPI  Chief Complaint  Patient presents with   Follow-up    F/U on asthma. States he had recently an asthma flare up. Had been using the albuterol as needed. Restarted the [facility] today.    Reason for Visit: Asthma  [name] is an 32 year old male never smoker with no prior medical health issues who presents for follow-up with chronic cough.  Initial consult 08/03/19 He reports two month history of chronic cough. When he talks rapidly and quickly, he feels like his lungs "deflate" quickly and result in rapid exhalations or dry cough. It seems to happen more frequently in the evening and night. He has a sensation of a hair or irritation in his throat prior to the cough. The last time he was ill was in January and was treated with antibiotics.  He has hx of nonsignificant sinus congestions that are usually seasonal. Denies changes in taste or smell. He currently works at home since March. Denies recent travel except to the beach one month ago in [place]. Denies chest tightness, shortness of breath, wheezing. He has a prior history of reflux in college.   03/12/23 Last seen in the office on 04/16/21 with NP Parrett with recommendations for bronchodilator at that visit.  He has not been on maintenance inhalers for >1.5 years. Has not had any issues with his asthma until this weekend when he experienced shortness of breath, chest tightness/compression, difficulty speaking, cough/spasm. This would wake up him at night/early morning. Unclear trigger. He has been using albuterol every 2-3 hours which caused jitterness. Has had success with Dulera in the past before he self-discontinued.      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 Owned >1 year. Previously a Building services engineer exposures:  Lives in [place]   Allergies  Allergen  Reactions   Tramadol Nausea And Vomiting     Outpatient Medications Prior to Visit  Medication Sig Dispense Refill   acetaminophen (TYLENOL) 325 MG tablet Take 650 mg by mouth every 6 (six) hours as needed.     albuterol (VENTOLIN HFA) 108 (90 Base) MCG/ACT inhaler Inhale 2 puffs into the lungs every 6 (six) hours as needed for wheezing or shortness of breath. 6.7 g 0   mometasone-formoterol (DULERA) 100-5 MCG/ACT AERO Inhale 1 puff into the lungs 2 (two) times daily. 1 each 0   No facility-administered medications prior to visit.    Review of Systems  Constitutional:  Negative for chills, diaphoresis, fever, malaise/fatigue and weight loss.  HENT:  Negative for congestion.   Respiratory:  Positive for cough and shortness of breath. Negative for hemoptysis, sputum production and wheezing.   Cardiovascular:  Negative for chest pain (chest compression), palpitations and leg swelling.    Objective:   Vitals:   03/12/23 1503  BP: 114/62  Pulse: (!) 105  SpO2: 97%  Weight: 128 lb (58.1 kg)  Height: 5\' 7"  (1.702 m)   SpO2: 97 % (on RA)  Physical Exam: General: Well-appearing, no acute distress HENT: [place], AT Eyes: EOMI, no scleral icterus Respiratory: Clear to auscultation bilaterally.  No crackles, wheezing or rales Cardiovascular: RRR, -M/R/G, no JVD Extremities:-Edema,-tenderness Neuro: AAO x4, CNII-XII grossly intact Psych: Normal mood, normal affect  Data Reviewed:  Imaging: CXR 08/03/19 - Mild hyperinflation. Will correlate with PFTs  PFT: 10/01/19 FVC 5.64 (109%) FEV1 4.41 (104%) Ratio 77  TLC 112% DLCO 108%. No significant bronchodilator response Interpretation: Normal PFTs  Labs: CBC    Component Value Date/Time   WBC 5.7 08/03/2019 1531   RBC 4.54 08/03/2019 1531   HGB 14.0 08/03/2019 1531   HCT 41.4 08/03/2019 1531   PLT 245.0 08/03/2019 1531   MCV 91.0 08/03/2019 1531   MCHC 33.8 08/03/2019 1531   RDW 13.1 08/03/2019 1531   LYMPHSABS 2.3 08/03/2019  1531   MONOABS 0.4 08/03/2019 1531   EOSABS 0.1 08/03/2019 1531   BASOSABS 0.0 08/03/2019 1531      Assessment & Plan:   Discussion: 32 year old male with allergic rhinitis, asthma who presents for follow-up. Prior PFTs were normal however symptoms now sound more consistent with asthma including the described bronchospasms, chest tightness, cough and shortness of breath. Has restarted on asthma. Reasonable to restart/continue his current low ICS/LABA  Asthma --CONTINUE Dulera 100-5 mcg TWO puffs in the morning and evening. Rinse mouth after use. --CONTINUE Albuterol TWO puffs as needed for shortness of breath or wheezing   Health Maintenance Immunization History  Administered Date(s) Administered   Moderna Sars-Covid-2 Vaccination 11/15/2019, 12/30/2019     No orders of the defined types were placed in this encounter.  Meds ordered this encounter  Medications   mometasone-formoterol (DULERA) 100-5 MCG/ACT AERO    Sig: Inhale 1 puff into the lungs 2 (two) times daily.    Dispense:  1 each    Refill:  2    Return in about 6 months (around 09/11/2023).  I have spent a total time of 35-minutes on the day of the appointment including chart review, data review, collecting history, coordinating care and discussing medical diagnosis and plan with the patient/family. Past medical history, allergies, medications were reviewed. Pertinent imaging, labs and tests included in this note have been reviewed and interpreted independently by me.  Illya Gienger Mechele Collin, MD [facility] 03/12/2023 3:38 PM  Office Number [phone]

## 2023-03-14 DIAGNOSIS — R1013 Epigastric pain: Secondary | ICD-10-CM | POA: Diagnosis not present

## 2023-03-14 DIAGNOSIS — K5909 Other constipation: Secondary | ICD-10-CM | POA: Diagnosis not present

## 2023-04-04 ENCOUNTER — Other Ambulatory Visit: Payer: Self-pay | Admitting: Pulmonary Disease

## 2023-04-04 ENCOUNTER — Telehealth: Payer: Self-pay | Admitting: Pulmonary Disease

## 2023-04-04 MED ORDER — DULERA 100-5 MCG/ACT IN AERO: 1.0000 | INHALATION_SPRAY | Freq: Two times a day (BID) | RESPIRATORY_TRACT | 10 refills | Status: DC

## 2023-04-04 NOTE — Telephone Encounter (Signed)
Py. Calling in regards to [facility] he needs more refills please advise

## 2023-04-04 NOTE — Telephone Encounter (Signed)
Refills has been sent, pt has been notified. NFN

## 2023-04-17 DIAGNOSIS — R140 Abdominal distension (gaseous): Secondary | ICD-10-CM | POA: Diagnosis not present

## 2023-04-17 DIAGNOSIS — R5383 Other fatigue: Secondary | ICD-10-CM | POA: Diagnosis not present

## 2023-04-17 DIAGNOSIS — R110 Nausea: Secondary | ICD-10-CM | POA: Diagnosis not present

## 2023-04-17 DIAGNOSIS — R142 Eructation: Secondary | ICD-10-CM | POA: Diagnosis not present

## 2023-04-18 ENCOUNTER — Other Ambulatory Visit: Payer: Self-pay | Admitting: Family Medicine

## 2023-04-18 DIAGNOSIS — R5383 Other fatigue: Secondary | ICD-10-CM

## 2023-04-18 DIAGNOSIS — R142 Eructation: Secondary | ICD-10-CM

## 2023-04-18 DIAGNOSIS — R634 Abnormal weight loss: Secondary | ICD-10-CM

## 2023-04-18 DIAGNOSIS — R140 Abdominal distension (gaseous): Secondary | ICD-10-CM

## 2023-04-18 DIAGNOSIS — R110 Nausea: Secondary | ICD-10-CM

## 2023-04-21 ENCOUNTER — Other Ambulatory Visit: Payer: BC Managed Care – PPO

## 2023-04-23 ENCOUNTER — Other Ambulatory Visit: Payer: BC Managed Care – PPO

## 2023-04-23 ENCOUNTER — Ambulatory Visit: Admission: RE | Admit: 2023-04-23 | Payer: BC Managed Care – PPO | Source: Ambulatory Visit

## 2023-04-23 DIAGNOSIS — R5383 Other fatigue: Secondary | ICD-10-CM | POA: Diagnosis not present

## 2023-04-23 DIAGNOSIS — R110 Nausea: Secondary | ICD-10-CM | POA: Diagnosis not present

## 2023-04-23 DIAGNOSIS — R140 Abdominal distension (gaseous): Secondary | ICD-10-CM | POA: Diagnosis not present

## 2023-04-23 DIAGNOSIS — R634 Abnormal weight loss: Secondary | ICD-10-CM

## 2023-04-23 DIAGNOSIS — R142 Eructation: Secondary | ICD-10-CM

## 2023-04-23 MED ORDER — IOPAMIDOL (ISOVUE-300) INJECTION 61%
100.0000 mL | Freq: Once | INTRAVENOUS | Status: AC | PRN
  Administered 2023-04-23: 100 mL via INTRAVENOUS

## 2023-06-09 DIAGNOSIS — R6881 Early satiety: Secondary | ICD-10-CM | POA: Diagnosis not present

## 2023-06-09 DIAGNOSIS — K219 Gastro-esophageal reflux disease without esophagitis: Secondary | ICD-10-CM | POA: Diagnosis not present

## 2023-06-09 DIAGNOSIS — R109 Unspecified abdominal pain: Secondary | ICD-10-CM | POA: Diagnosis not present

## 2023-06-09 DIAGNOSIS — K5900 Constipation, unspecified: Secondary | ICD-10-CM | POA: Diagnosis not present

## 2023-06-26 ENCOUNTER — Ambulatory Visit: Payer: Self-pay

## 2023-06-26 ENCOUNTER — Emergency Department (HOSPITAL_BASED_OUTPATIENT_CLINIC_OR_DEPARTMENT_OTHER)
Admission: EM | Admit: 2023-06-26 | Discharge: 2023-06-26 | Disposition: A | Discharge status: Home / Self Care | Payer: BC Managed Care – PPO | Attending: Emergency Medicine | Admitting: Emergency Medicine

## 2023-06-26 ENCOUNTER — Other Ambulatory Visit: Payer: Self-pay

## 2023-06-26 ENCOUNTER — Encounter (HOSPITAL_BASED_OUTPATIENT_CLINIC_OR_DEPARTMENT_OTHER): Payer: Self-pay | Admitting: Emergency Medicine

## 2023-06-26 DIAGNOSIS — K5641 Fecal impaction: Secondary | ICD-10-CM | POA: Diagnosis not present

## 2023-06-26 MED ORDER — LIDOCAINE HCL URETHRAL/MUCOSAL 2 % EX GEL
1.0000 | Freq: Once | CUTANEOUS | Status: AC
  Administered 2023-06-26: 1 via TOPICAL
  Filled 2023-06-26: qty 11

## 2023-06-26 MED ORDER — PEG 3350-KCL-NABCB-NACL-NASULF 240 G PO SOLR: 4000.0000 mL | Freq: Once | ORAL | 0 refills | Status: AC

## 2023-06-26 NOTE — ED Provider Notes (Signed)
[facility] Provider Note   CSN: [id number] Arrival date & time: 06/26/23  1343     History Chief Complaint  Patient presents with   Fecal Impaction    Jason Collins is a 32 y.o. male patient who presents to the emergency department today for further evaluation of constipation.  Patient states he is been suffering from constipation chronically since he was 32 years old.  He currently has outpatient GI follow-up where he scheduled for an endoscopy and colonoscopy.  His last bowel movement was done on Friday.  His normal bowel frequency is 1-2 times per week.  He states that he has recently increased his fiber intake and veggie intake.  He denies any abdominal pain, rectal pain but does endorse rectal pressure, vomiting, fever, chills.  HPI     Home Medications Prior to Admission medications   Medication Sig Start Date End Date Taking? Authorizing Provider  polyethylene glycol (COLYTE) 240 g solution Take 4,000 mLs by mouth once for 1 dose. 06/26/23 06/26/23 Yes Ryiah Bellissimo M, PA-C  acetaminophen (TYLENOL) 325 MG tablet Take 650 mg by mouth every 6 (six) hours as needed. 03/21/21   [provider]  albuterol (VENTOLIN HFA) 108 (90 Base) MCG/ACT inhaler INHALE 2 PUFFS INTO THE LUNGS EVERY 6 HOURS AS NEEDED FOR WHEEZING OR SHORTNESS OF BREATH 04/10/23   Luciano Cutter, MD  mometasone-formoterol [facility]) 100-5 MCG/ACT AERO Inhale 1 puff into the lungs 2 (two) times daily. 04/04/23   Luciano Cutter, MD      Allergies    Tramadol    Review of Systems   Review of Systems  All other systems reviewed and are negative.   Physical Exam Updated Vital Signs BP 121/78 (BP Location: Right Arm)   Pulse 78   Temp 97.8 F (36.6 C) (Oral)   Resp 16   SpO2 100%  Physical Exam Vitals and nursing note reviewed.  Constitutional:      General: He is not in acute distress.    Appearance: Normal appearance.  HENT:     Head:  Normocephalic and atraumatic.  Eyes:     General:        Right eye: No discharge.        Left eye: No discharge.  Cardiovascular:     Comments: Regular rate and rhythm.  S1/S2 are distinct without any evidence of murmur, rubs, or gallops.  Radial pulses are 2+ bilaterally.  Dorsalis pedis pulses are 2+ bilaterally.  No evidence of pedal edema. Pulmonary:     Comments: Clear to auscultation bilaterally.  Normal effort.  No respiratory distress.  No evidence of wheezes, rales, or rhonchi heard throughout. Abdominal:     General: Abdomen is flat. Bowel sounds are normal. There is no distension.     Tenderness: There is no abdominal tenderness. There is no guarding or rebound.  Genitourinary:    Comments: Rectal exam did reveal good rectal tone.  There was stool present in the rectal vault. Musculoskeletal:        General: Normal range of motion.     Cervical back: Neck supple.  Skin:    General: Skin is warm and dry.     Findings: No rash.  Neurological:     General: No focal deficit present.     Mental Status: He is alert.  Psychiatric:        Mood and Affect: Mood normal.        Behavior: Behavior  normal.     ED Results / Procedures / Treatments   Labs (all labs ordered are listed, but only abnormal results are displayed) Labs Reviewed - No data to display  EKG None  Radiology No results found.  Procedures Procedures    Medications Ordered in ED Medications  lidocaine (XYLOCAINE) 2 % jelly 1 Application (1 Application Topical Given 06/26/23 1728)    ED Course/ Medical Decision Making/ A&P Clinical Course as of 06/26/23 1734  Thu Jun 26, 2023  1541 I tended to perform a manual disimpaction at the bedside but the patient was tense and ultimately the procedure was abandoned due to patient discomfort.  Will attempt to perform a soapsuds enema. [CF]  1722 After a few attempts of soapsuds enema patient had a large bowel movement and feels much better.  I will likely  prescribe him GoLytely to take for a full bowel cleanout at home.  Patient agreeable to plan.  He is safe for discharge. [CF]    Clinical Course User Index [CF] Teressa Lower, PA-C   {   Click here for ABCD2, HEART and other calculators  Medical Decision Making Jason Collins is a 32 y.o. male patient who presents to the emergency department today for further evaluation of constipation.  Do suspect a fecal impaction.  Attempted manual impaction at the bedside which was abandoned secondary to patient discomfort.  Will plan to proceed with a soapsuds enema at this time.  I have a low suspicion for bowel obstruction at this time.  Patient had a large bowel movement here in the emergency department after subset enema.  I will give him a prescription for GoLytely for bowel cleanout.  Strict turn precaution were discussed.  He already has follow-up with GI.  He is safe for discharge.  Risk Prescription drug management.    Final Clinical Impression(s) / ED Diagnoses Final diagnoses:  Fecal impaction [facility])    Rx / DC Orders ED Discharge Orders          Ordered    polyethylene glycol (COLYTE) 240 g solution   Once        06/26/23 1729              Teressa Lower, PA-C 06/26/23 1734    Melene Plan, DO 06/26/23 1736

## 2023-06-26 NOTE — ED Notes (Signed)
Successful enema

## 2023-06-26 NOTE — ED Notes (Signed)
Pt. Sitting on [facility]

## 2023-06-26 NOTE — Telephone Encounter (Signed)
  Chief Complaint: Constipation Symptoms: Unable to have BM Frequency: Saturday Pertinent Negatives: Patient denies  Disposition: [] ED /[x] Urgent Care (no appt availability in office) / [] Appointment(In office/virtual)/ []  [facility]/ [] Home Care/ [] Refused Recommended Disposition /[] [place] [facility]/ []  Follow-up with PCP Additional Notes: Pt has had constipation his entire life. Pt states that he has not had a BM since Saturday. Pt has taken MOM and feels that stool has moved towards rectum, but is too large to pass.  Pt was driving to UC while speaking with me. Pt will go to UC on [address] for care.  Summary: Severe constipation   Lifelong severe constipation, acid reflux, feels like a hard stool that will not pass. Seeking advice for current case  Best contact:  [phone]       Reason for Disposition  Last bowel movement (BM) > 4 days ago  Answer Assessment - Initial Assessment Questions 1. STOOL PATTERN OR FREQUENCY: "How often do you have a bowel movement (BM)?"  (Normal range: 3 times a day to every 3 days)  "When was your last BM?"       Last friday 2. STRAINING: "Do you have to strain to have a BM?"      Yes 6. CHRONIC CONSTIPATION: "Is this a new problem for you?"  If No, ask: "How long have you had this problem?" (days, weeks, months)      All his life 9. LAXATIVES: "Have you been using any stool softeners, laxatives, or enemas?"  If Yes, ask "What, how often, and when was the last time?"     MOM 12. OTHER SYMPTOMS: "Do you have any other symptoms?" (e.g., abdomen pain, bloating, fever, vomiting)       Bloating acid reflux 13. MEDICAL HISTORY: "Do you have a history of hemorrhoids, rectal fissures, or rectal surgery or rectal abscess?"         Long term constipation.  Protocols used: Constipation-A-AH

## 2023-06-26 NOTE — Discharge Instructions (Signed)
As we discussed, you had what is called a fecal impaction today.  I have given you a prescription for PEG which you can take as prescribed.  This will give you a good colon cleanse.  Please drink plenty of fluids.  Keep your appointments with your GI doctors and return to the emergency department for any worsening symptoms.

## 2023-06-26 NOTE — ED Notes (Signed)
Soap suds emema done on Pt.

## 2023-06-26 NOTE — ED Notes (Signed)
Pt. Reports he has been having trouble with constipation all of his life.  Today he feels like he has a big amt of stool at the end of his rectum but he can't poop.

## 2023-06-26 NOTE — ED Triage Notes (Addendum)
Pt believes he has a fecal impaction.  Pt suffers from chronic constipation.  Pt has been taking miralax once a week.  No fevers, no abdominal pain.  Pt has some nausea but no vomiting.  Pt states his nausea started in May.  Pt has an appt with GI for follow up.  Pt is having a lot pressure and is comfortable.

## 2023-07-10 DIAGNOSIS — K5900 Constipation, unspecified: Secondary | ICD-10-CM | POA: Diagnosis not present

## 2023-07-10 DIAGNOSIS — R634 Abnormal weight loss: Secondary | ICD-10-CM | POA: Diagnosis not present

## 2023-08-25 DIAGNOSIS — Z713 Dietary counseling and surveillance: Secondary | ICD-10-CM | POA: Diagnosis not present

## 2023-09-03 DIAGNOSIS — Z0000 Encounter for general adult medical examination without abnormal findings: Secondary | ICD-10-CM | POA: Diagnosis not present

## 2023-09-03 DIAGNOSIS — Z13220 Encounter for screening for lipoid disorders: Secondary | ICD-10-CM | POA: Diagnosis not present

## 2023-09-03 DIAGNOSIS — Z23 Encounter for immunization: Secondary | ICD-10-CM | POA: Diagnosis not present

## 2023-09-03 DIAGNOSIS — Z131 Encounter for screening for diabetes mellitus: Secondary | ICD-10-CM | POA: Diagnosis not present

## 2023-10-21 ENCOUNTER — Other Ambulatory Visit (HOSPITAL_COMMUNITY): Payer: Self-pay | Admitting: Internal Medicine

## 2023-10-21 DIAGNOSIS — R6881 Early satiety: Secondary | ICD-10-CM

## 2023-12-02 ENCOUNTER — Other Ambulatory Visit: Payer: Self-pay

## 2023-12-02 ENCOUNTER — Telehealth: Payer: Self-pay

## 2023-12-02 ENCOUNTER — Other Ambulatory Visit (HOSPITAL_COMMUNITY): Payer: Self-pay

## 2023-12-02 ENCOUNTER — Telehealth: Payer: Self-pay | Admitting: Pulmonary Disease

## 2023-12-02 MED ORDER — ALBUTEROL SULFATE HFA 108 (90 BASE) MCG/ACT IN AERS: 2.0000 | INHALATION_SPRAY | Freq: Four times a day (QID) | RESPIRATORY_TRACT | 5 refills | Status: AC | PRN

## 2023-12-02 MED ORDER — DULERA 100-5 MCG/ACT IN AERO: 1.0000 | INHALATION_SPRAY | Freq: Two times a day (BID) | RESPIRATORY_TRACT | 3 refills | Status: AC

## 2023-12-02 NOTE — Telephone Encounter (Signed)
 Spoke with patient regarding prior message    Pharmacy Patient Advocate Encounter   Received notification from [employer] that Prior Authorization for [facility] 100-5MCG/ACT aerosol  has been APPROVED from 12/02/2023 to 12/01/2024. Ran test claim, Copay is $192.41/90 days. This test claim was processed through [facility]- copay amounts may vary at other pharmacies due to pharmacy/plan contracts, or as the patient moves through the different stages of their insurance plan.    PA #/Case ID/Reference #: [id number]   *called patient pharmacy and left message of approval  Patient stated he also needed a refill on Albuterol . [facility].  Patient's voice was understanding.Nothing else further needed.

## 2023-12-02 NOTE — Telephone Encounter (Signed)
*  Pulm  Pharmacy Patient Advocate Encounter  Received notification from [employer] that Prior Authorization for [facility] 100-5MCG/ACT aerosol  has been APPROVED from 12/02/2023 to 12/01/2024. Ran test claim, Copay is $192.41/90 days. This test claim was processed through [facility]- copay amounts may vary at other pharmacies due to pharmacy/plan contracts, or as the patient moves through the different stages of their insurance plan.   PA #/Case ID/Reference #: [id number]  *called patient pharmacy and left message of approval

## 2023-12-02 NOTE — Telephone Encounter (Signed)
 Patient needs provider authorization for his albuterol inhaler.   Pharmacy: [facility] off [address] in [place]

## 2023-12-02 NOTE — Telephone Encounter (Signed)
 Rx was sent this morning.

## 2024-07-20 DIAGNOSIS — R6881 Early satiety: Secondary | ICD-10-CM | POA: Diagnosis not present

## 2024-07-20 DIAGNOSIS — R634 Abnormal weight loss: Secondary | ICD-10-CM | POA: Diagnosis not present

## 2024-07-20 DIAGNOSIS — K5900 Constipation, unspecified: Secondary | ICD-10-CM | POA: Diagnosis not present
